# Patient Record
Sex: Female | Born: 2005 | Race: White | Hispanic: Yes | Marital: Single | Smoking: Never smoker
Health system: Southern US, Community
[De-identification: ages and names within clinical notes are randomized; demographics above are authoritative.]

## PROBLEM LIST (undated history)

## (undated) DIAGNOSIS — R519 Headache, unspecified: Secondary | ICD-10-CM

## (undated) DIAGNOSIS — R51 Headache: Secondary | ICD-10-CM

## (undated) HISTORY — DX: Headache: R51

## (undated) HISTORY — DX: Headache, unspecified: R51.9

---
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
## 2023-06-04 ENCOUNTER — Other Ambulatory Visit: Payer: Self-pay | Admitting: Oncology

## 2023-06-04 DIAGNOSIS — Z006 Encounter for examination for normal comparison and control in clinical research program: Secondary | ICD-10-CM

## 2024-09-03 ENCOUNTER — Other Ambulatory Visit: Payer: Self-pay | Admitting: Medical Genetics

## 2024-09-03 DIAGNOSIS — Z006 Encounter for examination for normal comparison and control in clinical research program: Secondary | ICD-10-CM

## 2024-10-03 LAB — GENECONNECT MOLECULAR SCREEN: Genetic Analysis Overall Interpretation: NEGATIVE
